# Patient Record
Sex: Male | Born: 1995 | Race: Black or African American | Hispanic: No | Marital: Single | Smoking: Never smoker
Health system: Southern US, Community
[De-identification: ages and names within clinical notes are randomized; demographics above are authoritative.]

## PROBLEM LIST (undated history)

## (undated) DIAGNOSIS — T7840XA Allergy, unspecified, initial encounter: Secondary | ICD-10-CM

## (undated) HISTORY — DX: Allergy, unspecified, initial encounter: T78.40XA

---
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
## 2013-06-17 ENCOUNTER — Encounter (HOSPITAL_COMMUNITY): Payer: Self-pay | Admitting: Emergency Medicine

## 2013-06-17 ENCOUNTER — Emergency Department (INDEPENDENT_AMBULATORY_CARE_PROVIDER_SITE_OTHER)
Admission: EM | Admit: 2013-06-17 | Discharge: 2013-06-17 | Disposition: A | Discharge status: Home / Self Care | Payer: Self-pay | Source: Home / Self Care | Attending: Family Medicine | Admitting: Family Medicine

## 2013-06-17 DIAGNOSIS — H6692 Otitis media, unspecified, left ear: Secondary | ICD-10-CM

## 2013-06-17 DIAGNOSIS — H6690 Otitis media, unspecified, unspecified ear: Secondary | ICD-10-CM

## 2013-06-17 MED ORDER — IPRATROPIUM BROMIDE 0.06 % NA SOLN: 2.0000 | Freq: Four times a day (QID) | NASAL | Status: DC

## 2013-06-17 MED ORDER — AMOXICILLIN 500 MG PO CAPS: 500.0000 mg | ORAL_CAPSULE | Freq: Three times a day (TID) | ORAL | Status: DC

## 2013-06-17 NOTE — ED Notes (Signed)
Pt  Reports  Last  Week  He  Had  A  Cold  [facility]  [facility] [facility]  [facility]   [place]  [place]  And  Felt a  P[op  In his  l  Ear  He  Now  Has  A  Muffled  Sensation and  l  Earache       He  States  He  Has  Had  Earaches  In past  And knows  What they feel like

## 2013-06-17 NOTE — Discharge Instructions (Signed)
Take all of medicine , use tylenol or advil for pain and fever as needed, see your doctor in 10 - 14 days for ear recheck if further problems. °

## 2013-06-17 NOTE — ED Provider Notes (Signed)
CSN: [id number]     Arrival date & time 06/17/13  1910 History   First MD Initiated Contact with Patient 06/17/13 1943     Chief Complaint  Patient presents with  . Otalgia   (Consider location/radiation/quality/duration/timing/severity/associated sxs/prior Treatment) Patient is a 18 y.o. male presenting with ear pain. The history is provided by the patient.  Otalgia Location:  Left Quality:  Pressure Severity:  Mild Onset quality:  Gradual Duration:  3 days Progression:  Worsening Chronicity:  New Context comment:  Recent uri Associated symptoms: congestion and rhinorrhea   Associated symptoms: no fever     History reviewed. No pertinent past medical history. History reviewed. No pertinent past surgical history. History reviewed. No pertinent family history. History  Substance Use Topics  . Smoking status: Not on file  . Smokeless tobacco: Not on file  . Alcohol Use: No    Review of Systems  Constitutional: Negative for fever.  HENT: Positive for congestion, ear pain and rhinorrhea.   Respiratory: Negative.     Allergies  Review of patient's allergies indicates no known allergies.  Home Medications   Current Outpatient Rx  Name  Route  Sig  Dispense  Refill  . amoxicillin (AMOXIL) 500 MG capsule   Oral   Take 1 capsule (500 mg total) by mouth 3 (three) times daily.   30 capsule   0   . ipratropium (ATROVENT) 0.06 % nasal spray   Nasal   Place 2 sprays into the nose 4 (four) times daily.   15 mL   1    BP 152/91  Pulse 64  Temp(Src) 98.3 F (36.8 C) (Oral)  Resp 16  SpO2 98% Physical Exam  Nursing note and vitals reviewed. Constitutional: He is oriented to person, place, and time. He appears well-developed and well-nourished.  HENT:  Head: Normocephalic.  Right Ear: Hearing, tympanic membrane, external ear and ear canal normal.  Left Ear: External ear and ear canal normal. Tympanic membrane is scarred, erythematous and bulging. Tympanic membrane  mobility is abnormal.  Mouth/Throat: Oropharynx is clear and moist.  Eyes: Conjunctivae and EOM are normal. Pupils are equal, round, and reactive to light.  Neck: Normal range of motion. Neck supple.  Lymphadenopathy:    He has no cervical adenopathy.  Neurological: He is alert and oriented to person, place, and time.  Skin: Skin is warm and dry.    ED Course  Procedures (including critical care time) Labs Review Labs Reviewed - No data to display Imaging Review No results found.  EKG Interpretation    Date/Time:    Ventricular Rate:    PR Interval:    QRS Duration:   QT Interval:    QTC Calculation:   R Axis:     Text Interpretation:              MDM      Linna HoffJames D Kindl, MD 06/17/13 2004

## 2013-09-05 IMAGING — CR DG CHEST 2V
2 series · 2 of 2 positions shown · non-contrast
Comparison: None.

CLINICAL DATA: Cough.

CHEST - 2 VIEW

[w chest pa]
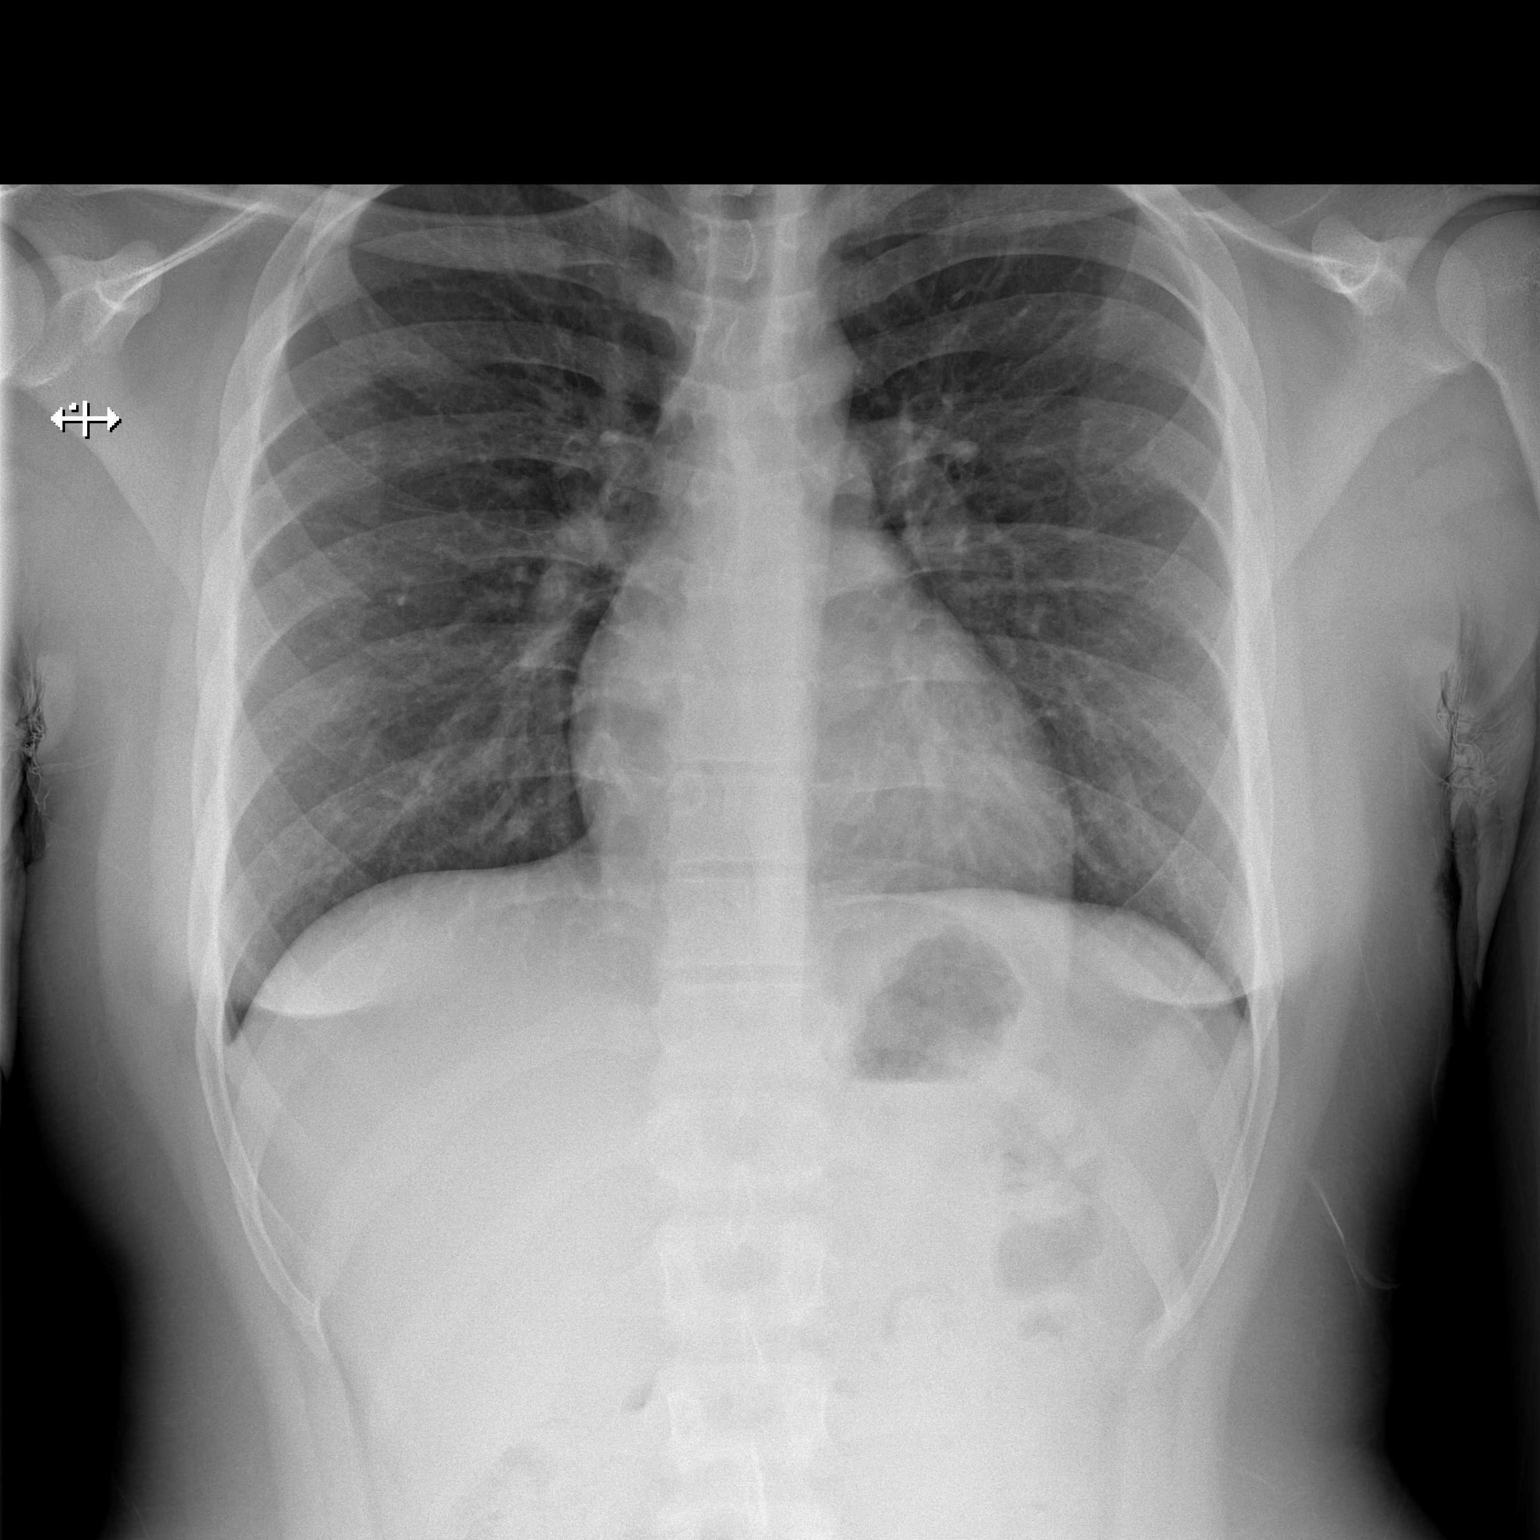

[w chest lat]
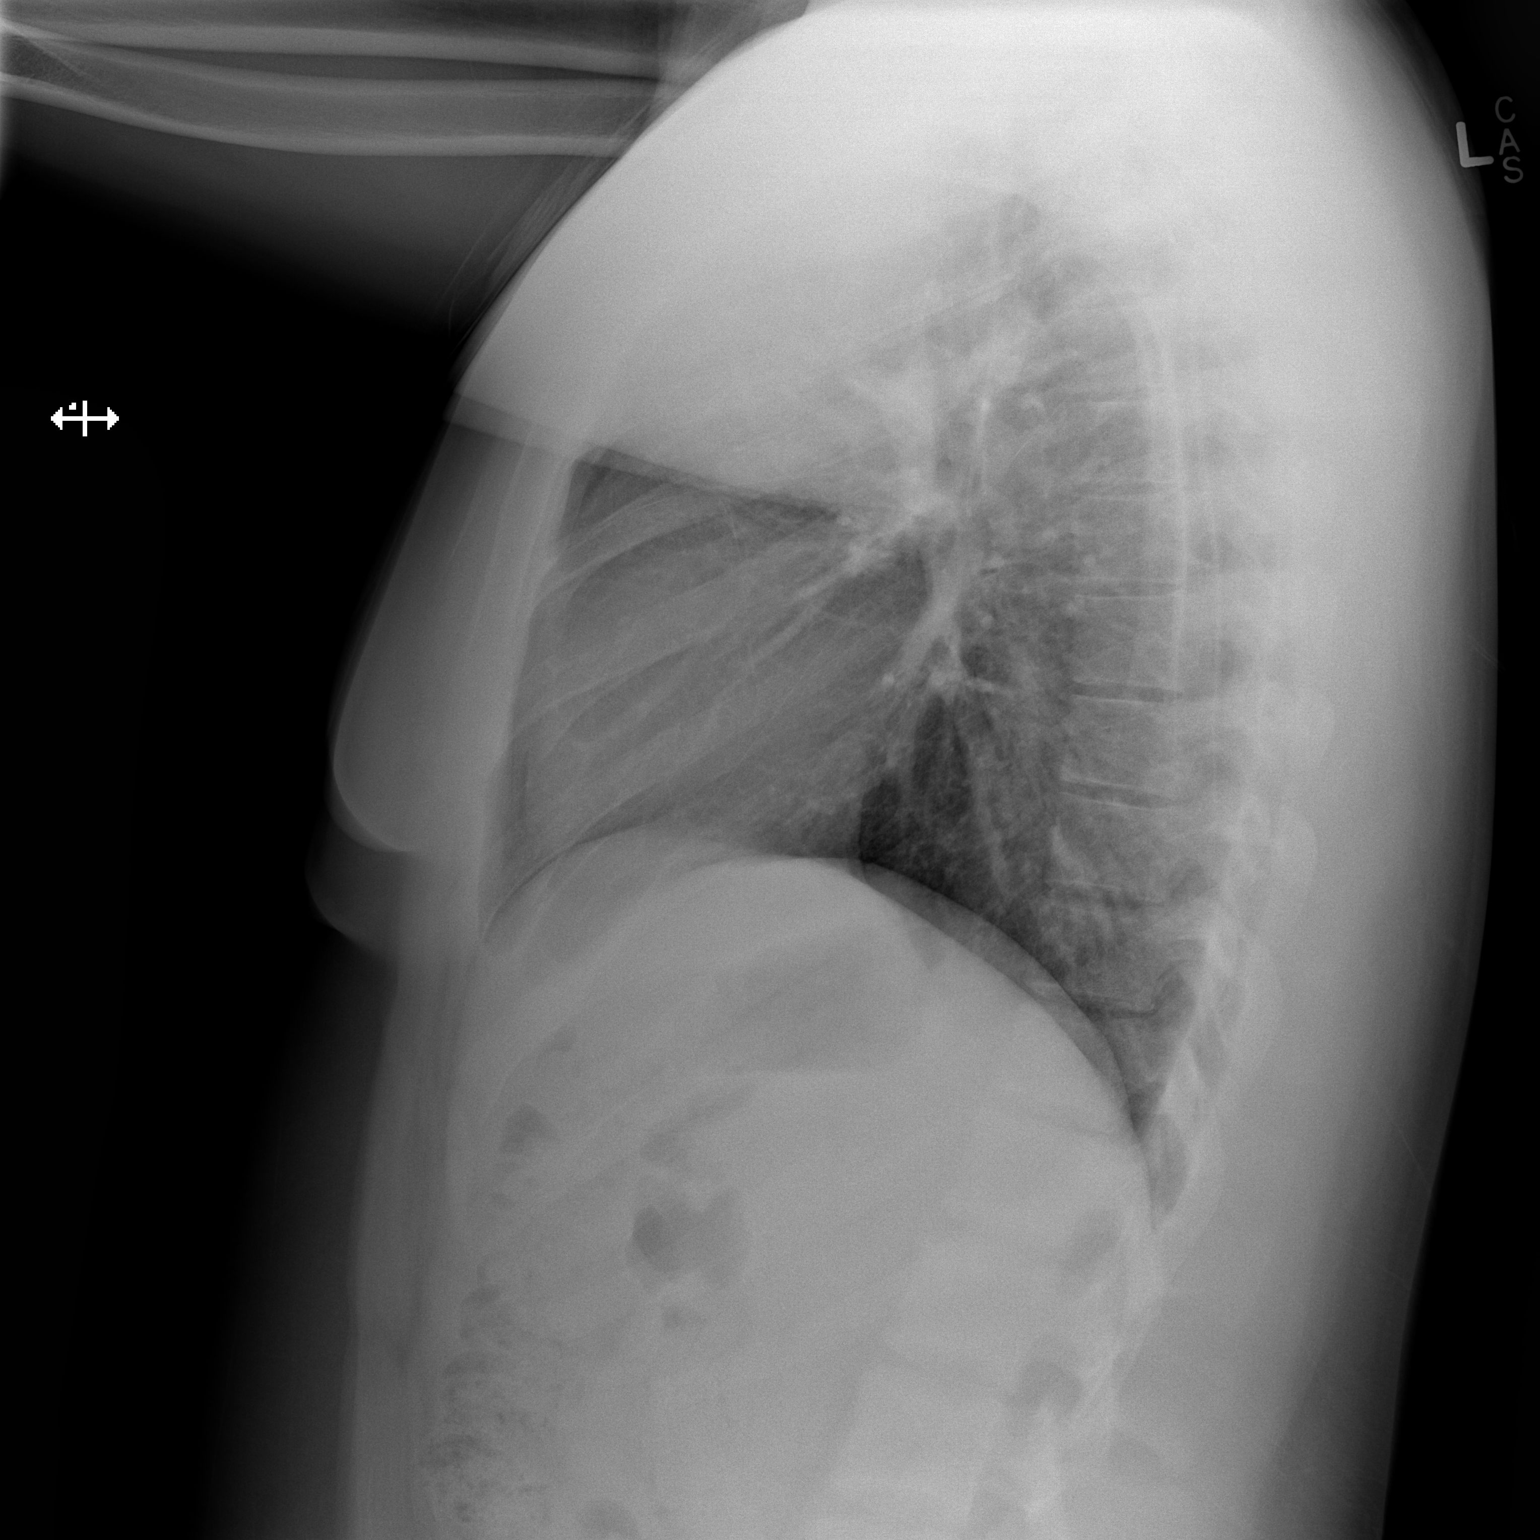

[2 of 2 positions shown; findings below may reference images not displayed]

FINDINGS: The heart size is normal.  The lungs are clear.  The
visualized soft tissues and bony thorax are unremarkable.
IMPRESSION: Negative chest.

## 2013-09-25 ENCOUNTER — Emergency Department (HOSPITAL_BASED_OUTPATIENT_CLINIC_OR_DEPARTMENT_OTHER)
Admission: EM | Admit: 2013-09-25 | Discharge: 2013-09-25 | Disposition: A | Discharge status: Home / Self Care | Payer: Self-pay | Attending: Emergency Medicine | Admitting: Emergency Medicine

## 2013-09-25 ENCOUNTER — Encounter (HOSPITAL_BASED_OUTPATIENT_CLINIC_OR_DEPARTMENT_OTHER): Payer: Self-pay | Admitting: Emergency Medicine

## 2013-09-25 DIAGNOSIS — Z792 Long term (current) use of antibiotics: Secondary | ICD-10-CM | POA: Insufficient documentation

## 2013-09-25 DIAGNOSIS — K6289 Other specified diseases of anus and rectum: Secondary | ICD-10-CM | POA: Insufficient documentation

## 2013-09-25 DIAGNOSIS — Z79899 Other long term (current) drug therapy: Secondary | ICD-10-CM | POA: Insufficient documentation

## 2013-09-25 NOTE — ED Notes (Signed)
Pt states pain started on Weds and thought was constipation, so took a stool softener, had a BM and states it hurt to "push it out".  No blood or external hemmrhoids noticed per patient. Pt denies any penetration of objects or other to rectal area.  Pt states pain comes and goes in waves and feeling like muscle cramping/stabbing pain.  No diarhea/nausea/vomiting.

## 2013-09-25 NOTE — ED Provider Notes (Signed)
CSN: [id number]     Arrival date & time 09/25/13  1119 History   First MD Initiated Contact with Patient 09/25/13 1148     Chief Complaint  Patient presents with  . Rectal Pain     (Consider location/radiation/quality/duration/timing/severity/associated sxs/prior Treatment) HPI Comments: Patient is a 18 year old male with no significant past medical history. He presents today with complaints of rectal pain. This is been going on for the past several days. He reports passing hard stool and states that it is painful to have a bowel movement. He denies any bleeding or trauma. He denies any anal penetration. He has tried stool softeners however this has not helped.  The history is provided by the patient.    History reviewed. No pertinent past medical history. History reviewed. No pertinent past surgical history. No family history on file. History  Substance Use Topics  . Smoking status: Not on file  . Smokeless tobacco: Not on file  . Alcohol Use: No    Review of Systems  All other systems reviewed and are negative.     Allergies  Review of patient's allergies indicates no known allergies.  Home Medications   Prior to Admission medications   Medication Sig Start Date End Date Taking? Authorizing Provider  amoxicillin (AMOXIL) 500 MG capsule Take 1 capsule (500 mg total) by mouth 3 (three) times daily. 06/17/13   Linna HoffJames D Kindl, MD  ipratropium (ATROVENT) 0.06 % nasal spray Place 2 sprays into the nose 4 (four) times daily. 06/17/13   Linna HoffJames D Kindl, MD   BP 121/85  Pulse 60  Temp(Src) 97.8 F (36.6 C) (Oral)  Resp 18  Ht 6' (1.829 m)  Wt 170 lb (77.111 kg)  BMI 23.05 kg/m2  SpO2 100% Physical Exam  Nursing note and vitals reviewed. Constitutional: He is oriented to person, place, and time. He appears well-developed and well-nourished. No distress.  HENT:  Head: Normocephalic and atraumatic.  Neck: Normal range of motion. Neck supple.  Genitourinary: Rectum normal.   The anus appears normal. There are no hemorrhoids, lesions, or other concerning findings. Rectal examination was limited due to pain.  Neurological: He is alert and oriented to person, place, and time.  Skin: Skin is warm and dry. He is not diaphoretic.    ED Course  Procedures (including critical care time) Labs Review Labs Reviewed - No data to display  Imaging Review No results found.   EKG Interpretation None      MDM   Final diagnoses:  None    Based on the nature of the patient's symptoms, I suspect this may be an anal fissure. My recommendations will be increased fiber, stool softeners, and followup with gastroenterology if not improving in the next several days. If he does not improve he may require a sigmoid or colonoscopy. He understands to return if he develops severe bleeding, high fever, or worsening pain.    Geoffery Lyonsouglas Rick Carruthers, MD 09/25/13 1159

## 2013-09-25 NOTE — Discharge Instructions (Signed)
Metamucil: 1 heaping teaspoon in a glass of water 3 times daily.  Colace (Equate stool softener): 100 mg twice daily. This medication is available over-the-counter.  Followup with gastroenterology if not improving in the next several days, and return to the emergency department if you develop severe leaving, high fever, or worsening pain.   Proctalgia Fugax Proctalgia fugax is a very short episode of intense rectal pain. It can last from seconds to minutes. It often occurs in the night, and awakens the person from sleep. It is not a sign of cancer.  CAUSES  The cause of this often intense rectal pain is not known. One possible cause may be spasm of the pelvic muscles or of the lowest part of the large intestine.  SYMPTOMS  The pain of proctalgia fugax:  Is intensely severe.  Lasts from only a few seconds to thirty minutes.  Usually awakens the person from sleep. DIAGNOSIS  In order to make sure that there are no other problems, diagnostic tests may be done such as:   Anoscopy. This is a lighted scope that is put into the rectum to look for abnormalities.  Barium enema. X-rays are taken after administering a radio-sensitive material. TREATMENT  A number of things have been used to try to treat this condition, including:  Medications.  Warm baths.  Relaxation techniques.  Gentle massage of the painful area. HOME CARE INSTRUCTIONS   Take all medications exactly as directed.  Follow any prescribed diet.  Follow instructions regarding both rest and physical activity.  Learn progressive relaxation techniques. SEEK IMMEDIATE MEDICAL CARE IF:   Your pain does not get better in the usual amount of time.  You develop any new symptoms. Document Released: 02/12/2001 Document Revised: 08/12/2011 Document Reviewed: 07/21/2008 [facility] Patient Information 2014 [place] [place].

## 2013-11-29 ENCOUNTER — Emergency Department (HOSPITAL_BASED_OUTPATIENT_CLINIC_OR_DEPARTMENT_OTHER)
Admission: EM | Admit: 2013-11-29 | Discharge: 2013-11-29 | Disposition: A | Discharge status: Home / Self Care | Payer: Self-pay | Attending: Emergency Medicine | Admitting: Emergency Medicine

## 2013-11-29 ENCOUNTER — Encounter (HOSPITAL_BASED_OUTPATIENT_CLINIC_OR_DEPARTMENT_OTHER): Payer: Self-pay | Admitting: Emergency Medicine

## 2013-11-29 DIAGNOSIS — Z79899 Other long term (current) drug therapy: Secondary | ICD-10-CM | POA: Insufficient documentation

## 2013-11-29 DIAGNOSIS — Z792 Long term (current) use of antibiotics: Secondary | ICD-10-CM | POA: Insufficient documentation

## 2013-11-29 DIAGNOSIS — R0982 Postnasal drip: Secondary | ICD-10-CM | POA: Insufficient documentation

## 2013-11-29 LAB — RAPID STREP SCREEN (MED CTR MEBANE ONLY): Streptococcus, Group A Screen (Direct): NEGATIVE

## 2013-11-29 MED ORDER — CETIRIZINE-PSEUDOEPHEDRINE ER 5-120 MG PO TB12: 1.0000 | ORAL_TABLET | Freq: Two times a day (BID) | ORAL | Status: DC

## 2013-11-29 MED ORDER — FLUTICASONE PROPIONATE 50 MCG/ACT NA SUSP: 2.0000 | Freq: Every day | NASAL | Status: DC

## 2013-11-29 NOTE — ED Notes (Signed)
Pt reports started having sore throat since last Monday but got a little better throughout the week now hurting again and pain with swallowing.  Denies cough, nasal congestion or fever.

## 2013-11-29 NOTE — ED Provider Notes (Signed)
CSN: [id number]     Arrival date & time 11/29/13  0118 History   First MD Initiated Contact with Patient 11/29/13 0217     Chief Complaint  Patient presents with  . Sore Throat     (Consider location/radiation/quality/duration/timing/severity/associated sxs/prior Treatment) Patient is a 18 y.o. male presenting with pharyngitis. The history is provided by the patient.  Sore Throat This is a new problem. The current episode started more than 1 week ago. The problem occurs constantly. The problem has not changed since onset.Pertinent negatives include no chest pain, no abdominal pain, no headaches and no shortness of breath. Nothing aggravates the symptoms. Nothing relieves the symptoms. He has tried nothing for the symptoms. The treatment provided no relief.  And nasal congestion.    History reviewed. No pertinent past medical history. History reviewed. No pertinent past surgical history. History reviewed. No pertinent family history. History  Substance Use Topics  . Smoking status: Never Smoker   . Smokeless tobacco: Not on file  . Alcohol Use: No    Review of Systems  Constitutional: Negative for fever.  HENT: Positive for congestion and sore throat. Negative for drooling, facial swelling, trouble swallowing and voice change.   Respiratory: Negative for shortness of breath.   Cardiovascular: Negative for chest pain.  Gastrointestinal: Negative for abdominal pain.  Neurological: Negative for headaches.  All other systems reviewed and are negative.     Allergies  Review of patient's allergies indicates no known allergies.  Home Medications   Prior to Admission medications   Medication Sig Start Date End Date Taking? Authorizing Provider  amoxicillin (AMOXIL) 500 MG capsule Take 1 capsule (500 mg total) by mouth 3 (three) times daily. 06/17/13   Linna HoffJames D Kindl, MD  ipratropium (ATROVENT) 0.06 % nasal spray Place 2 sprays into the nose 4 (four) times daily. 06/17/13   Linna HoffJames D  Kindl, MD   BP 148/76  Pulse 62  Temp(Src) 98.3 F (36.8 C) (Oral)  Resp 16  Ht 6' (1.829 m)  Wt 165 lb (74.844 kg)  BMI 22.37 kg/m2  SpO2 99% Physical Exam  Constitutional: He is oriented to person, place, and time. He appears well-developed and well-nourished. No distress.  HENT:  Head: Normocephalic and atraumatic.  Mouth/Throat: Oropharynx is clear and moist. No oropharyngeal exudate.  Cobblestoning and clear colorless PND  Eyes: Conjunctivae are normal. Pupils are equal, round, and reactive to light.  Neck: Normal range of motion. Neck supple. No tracheal deviation present.  No pain with displacement of the trachea  Cardiovascular: Normal rate, regular rhythm and intact distal pulses.   Pulmonary/Chest: Effort normal and breath sounds normal. No respiratory distress. He has no wheezes. He has no rales.  Abdominal: Soft. Bowel sounds are normal. There is no tenderness. There is no rebound.  Musculoskeletal: Normal range of motion.  Lymphadenopathy:    He has no cervical adenopathy.  Neurological: He is alert and oriented to person, place, and time.  Skin: Skin is warm and dry.  Psychiatric: He has a normal mood and affect.    ED Course  Procedures (including critical care time) Labs Review Labs Reviewed  RAPID STREP SCREEN  CULTURE, GROUP A STREP    Imaging Review No results found.   EKG Interpretation None      MDM   Final diagnoses:  None    Post nasal drip, likely allergic in nature will treat for same.      April Smitty CordsK Palumbo-Rasch, MD 11/29/13 0630

## 2013-11-30 LAB — CULTURE, GROUP A STREP

## 2015-04-20 ENCOUNTER — Other Ambulatory Visit: Payer: Self-pay | Admitting: *Deleted

## 2015-04-20 ENCOUNTER — Ambulatory Visit (INDEPENDENT_AMBULATORY_CARE_PROVIDER_SITE_OTHER): Payer: Self-pay | Admitting: Pharmacist Clinician (PhC)/ Clinical Pharmacy Specialist

## 2015-04-20 ENCOUNTER — Other Ambulatory Visit: Payer: Self-pay

## 2015-04-20 DIAGNOSIS — B20 Human immunodeficiency virus [HIV] disease: Secondary | ICD-10-CM

## 2015-04-20 DIAGNOSIS — Z206 Contact with and (suspected) exposure to human immunodeficiency virus [HIV]: Secondary | ICD-10-CM

## 2015-04-20 DIAGNOSIS — Z209 Contact with and (suspected) exposure to unspecified communicable disease: Secondary | ICD-10-CM

## 2015-04-20 LAB — HEPATITIS B CORE ANTIBODY, IGM: Hep B C IgM: NONREACTIVE

## 2015-04-20 LAB — HIV ANTIBODY (ROUTINE TESTING W REFLEX): HIV 1&2 Ab, 4th Generation: NONREACTIVE

## 2015-04-20 LAB — HEPATITIS C ANTIBODY: HCV Ab: NEGATIVE

## 2015-04-20 MED ORDER — EMTRICITABINE-TENOFOVIR DF 200-300 MG PO TABS: 1.0000 | ORAL_TABLET | Freq: Every day | ORAL | Status: DC

## 2015-04-20 MED ORDER — DOLUTEGRAVIR SODIUM 50 MG PO TABS: 50.0000 mg | ORAL_TABLET | Freq: Every day | ORAL | Status: DC

## 2015-04-20 NOTE — Progress Notes (Signed)
Patient ID: Cameron Patterson, male   DOB: 02/28/1996, 19 y.o.   MRN: [id number]  HPI: Cameron Patterson is a 19 y.o. male who presents for an initial evaluation for post-exposure prophylaxis (PEP).   Allergies: No Known Allergies  Vitals:    Past Medical History: No past medical history on file.  Social History: Social History   Social History  . Marital Status: Single    Spouse Name: N/A  . Number of Children: N/A  . Years of Education: N/A   Social History Main Topics  . Smoking status: Never Smoker   . Smokeless tobacco: Not on file  . Alcohol Use: No  . Drug Use: No  . Sexual Activity: Not on file   Other Topics Concern  . Not on file   Social History Narrative  . No narrative on file    Labs: No results found for: HIV1RNAQUANT, HIV1RNAVL, CD4TABS, HEPBSAB, HEPBSAG, HCVAB Rapid HIV: Negative   CrCl: CrCl cannot be calculated (Unknown ideal weight.).  Lipids: No results found for: CHOL, TRIG, HDL, CHOLHDL, VLDL, LDLCALC  Assessment: Mr. Cameron Patterson is a 19 year old male who presents for evaluation of post-exposure prophylaxis. He was sexually active with another male approximately 28 hours prior to his visit. He states his partner is known HIV positive and told him that he always takes his medications and his virus is controlled. Patient is concerned and would like to receive treatment with PEP.  I discussed with Mr. Cameron Patterson the treatment course we will use. We gave the patient 2 tablets of Genvoya, we observed the patient take his first tablet with us in the room. Patient was informed that we will treat him for a total of 28 days with Truvada and Tivicay. [facility] Pharmacy requested to fill 30 tablets so that they would not have to break bottles.  We also talked about future opportunities for PrEP. I encouraged the patient to think about this and to contact the clinic if he would like to discuss further.  Recommendations: - Tivicay and  Truvada 1 tablet daily for 28 days  - Patient instructed to call the clinic with any questions or to further discuss PrEP  Ancil Boozeraylor P Creasie Lacosse, PharmD. PGY-1 Pharmacy Resident Pager: [phone]  04/20/2015, 10:58 AM

## 2015-04-23 LAB — HIV-1 RNA QUANT-NO REFLEX-BLD

## 2015-05-20 ENCOUNTER — Other Ambulatory Visit: Payer: Self-pay | Admitting: Infectious Disease

## 2015-05-25 ENCOUNTER — Encounter (HOSPITAL_COMMUNITY): Payer: Self-pay

## 2015-05-25 ENCOUNTER — Emergency Department (HOSPITAL_COMMUNITY)
Admission: EM | Admit: 2015-05-25 | Discharge: 2015-05-25 | Disposition: A | Discharge status: Home / Self Care | Payer: No Typology Code available for payment source | Attending: Emergency Medicine | Admitting: Emergency Medicine

## 2015-05-25 DIAGNOSIS — X58XXXA Exposure to other specified factors, initial encounter: Secondary | ICD-10-CM | POA: Insufficient documentation

## 2015-05-25 DIAGNOSIS — J029 Acute pharyngitis, unspecified: Secondary | ICD-10-CM | POA: Diagnosis present

## 2015-05-25 DIAGNOSIS — S0121XA Laceration without foreign body of nose, initial encounter: Secondary | ICD-10-CM | POA: Insufficient documentation

## 2015-05-25 DIAGNOSIS — Z79899 Other long term (current) drug therapy: Secondary | ICD-10-CM | POA: Diagnosis not present

## 2015-05-25 DIAGNOSIS — Z792 Long term (current) use of antibiotics: Secondary | ICD-10-CM | POA: Insufficient documentation

## 2015-05-25 DIAGNOSIS — Y939 Activity, unspecified: Secondary | ICD-10-CM | POA: Diagnosis not present

## 2015-05-25 DIAGNOSIS — Y999 Unspecified external cause status: Secondary | ICD-10-CM | POA: Diagnosis not present

## 2015-05-25 DIAGNOSIS — Z7951 Long term (current) use of inhaled steroids: Secondary | ICD-10-CM | POA: Insufficient documentation

## 2015-05-25 DIAGNOSIS — Y929 Unspecified place or not applicable: Secondary | ICD-10-CM | POA: Insufficient documentation

## 2015-05-25 DIAGNOSIS — J069 Acute upper respiratory infection, unspecified: Secondary | ICD-10-CM | POA: Insufficient documentation

## 2015-05-25 LAB — RAPID STREP SCREEN (MED CTR MEBANE ONLY): Streptococcus, Group A Screen (Direct): NEGATIVE

## 2015-05-25 MED ORDER — IBUPROFEN 600 MG PO TABS: 600.0000 mg | ORAL_TABLET | Freq: Four times a day (QID) | ORAL | Status: DC | PRN

## 2015-05-25 MED ORDER — FEXOFENADINE-PSEUDOEPHED ER 60-120 MG PO TB12: 1.0000 | ORAL_TABLET | Freq: Two times a day (BID) | ORAL | Status: DC

## 2015-05-25 NOTE — Discharge Instructions (Signed)
Tylenol or ibuprofen for pain. Intranasal saline for congestion. Salt water gargles for sore throat. Take allegra d to help with congestions. Make sure to get plenty of rest, drink plenty of fluids. Follow up with primary care doctor for recheck     Upper Respiratory Infection, Adult Most upper respiratory infections (URIs) are a viral infection of the air passages leading to the lungs. A URI affects the nose, throat, and upper air passages. The most common type of URI is nasopharyngitis and is typically referred to as "the common cold." URIs run their course and usually go away on their own. Most of the time, a URI does not require medical attention, but sometimes a bacterial infection in the upper airways can follow a viral infection. This is called a secondary infection. Sinus and middle ear infections are common types of secondary upper respiratory infections. Bacterial pneumonia can also complicate a URI. A URI can worsen asthma and chronic obstructive pulmonary disease (COPD). Sometimes, these complications can require emergency medical care and may be life threatening.  CAUSES Almost all URIs are caused by viruses. A virus is a type of germ and can spread from one person to another.  RISKS FACTORS You may be at risk for a URI if:   You smoke.   You have chronic heart or lung disease.  You have a weakened defense (immune) system.   You are very young or very old.   You have nasal allergies or asthma.  You work in crowded or poorly ventilated areas.  You work in health care facilities or schools. SIGNS AND SYMPTOMS  Symptoms typically develop 2-3 days after you come in contact with a cold virus. Most viral URIs last 7-10 days. However, viral URIs from the influenza virus (flu virus) can last 14-18 days and are typically more severe. Symptoms may include:   Runny or stuffy (congested) nose.   Sneezing.   Cough.   Sore throat.   Headache.   Fatigue.   Fever.    Loss of appetite.   Pain in your forehead, behind your eyes, and over your cheekbones (sinus pain).  Muscle aches.  DIAGNOSIS  Your health care provider may diagnose a URI by:  Physical exam.  Tests to check that your symptoms are not due to another condition such as:  Strep throat.  Sinusitis.  Pneumonia.  Asthma. TREATMENT  A URI goes away on its own with time. It cannot be cured with medicines, but medicines may be prescribed or recommended to relieve symptoms. Medicines may help:  Reduce your fever.  Reduce your cough.  Relieve nasal congestion. HOME CARE INSTRUCTIONS   Take medicines only as directed by your health care provider.   Gargle warm saltwater or take cough drops to comfort your throat as directed by your health care provider.  Use a warm mist humidifier or inhale steam from a shower to increase air moisture. This may make it easier to breathe.  Drink enough fluid to keep your urine clear or pale yellow.   Eat soups and other clear broths and maintain good nutrition.   Rest as needed.   Return to work when your temperature has returned to normal or as your health care provider advises. You may need to stay home longer to avoid infecting others. You can also use a face mask and careful hand washing to prevent spread of the virus.  Increase the usage of your inhaler if you have asthma.   Do not use any tobacco products,  including cigarettes, chewing tobacco, or electronic cigarettes. If you need help quitting, ask your health care provider. PREVENTION  The best way to protect yourself from getting a cold is to practice good hygiene.   Avoid oral or hand contact with people with cold symptoms.   Wash your hands often if contact occurs.  There is no clear evidence that vitamin C, vitamin E, echinacea, or exercise reduces the chance of developing a cold. However, it is always recommended to get plenty of rest, exercise, and practice good  nutrition.  SEEK MEDICAL CARE IF:   You are getting worse rather than better.   Your symptoms are not controlled by medicine.   You have chills.  You have worsening shortness of breath.  You have brown or red mucus.  You have yellow or brown nasal discharge.  You have pain in your face, especially when you bend forward.  You have a fever.  You have swollen neck glands.  You have pain while swallowing.  You have white areas in the back of your throat. SEEK IMMEDIATE MEDICAL CARE IF:   You have severe or persistent:  Headache.  Ear pain.  Sinus pain.  Chest pain.  You have chronic lung disease and any of the following:  Wheezing.  Prolonged cough.  Coughing up blood.  A change in your usual mucus.  You have a stiff neck.  You have changes in your:  Vision.  Hearing.  Thinking.  Mood. MAKE SURE YOU:   Understand these instructions.  Will watch your condition.  Will get help right away if you are not doing well or get worse.   This information is not intended to replace advice given to you by your health care provider. Make sure you discuss any questions you have with your health care provider.   Document Released: 11/13/2000 Document Revised: 10/04/2014 Document Reviewed: 08/25/2013 Elsevier Interactive Patient Education [employer]

## 2015-05-25 NOTE — ED Notes (Signed)
Pt has had head cold symptoms since Sunday, sore throat, runny nose, cough, hoarse, denies fevers. Sore throat worse today.

## 2015-05-25 NOTE — ED Provider Notes (Signed)
CSN: [id number]     Arrival date & time 05/25/15  0531 History   First MD Initiated Contact with Patient 05/25/15 0601     Chief Complaint  Patient presents with  . Sore Throat     (Consider location/radiation/quality/duration/timing/severity/associated sxs/prior Treatment) HPI Cameron Patterson is a 19 y.o. male with no medical problems, presents to emergency department complaining of nasal congestion and sore throat. Patient states he has had nasal congestion, sore throat, cough for the last 3 days. He states this morning he woke up with worsening sore throat. He states he decided to come to emergency department because "I could not take the pain anymore." He has been taking Mucinex for the cough, and has tried some over-the-counter cold medication with no relief of her symptoms. He denies any fever or chills. He denies any shortness of breath.  History reviewed. No pertinent past medical history. History reviewed. No pertinent past surgical history. No family history on file. Social History  Substance Use Topics  . Smoking status: Never Smoker   . Smokeless tobacco: None  . Alcohol Use: No    Review of Systems  Constitutional: Negative for fever and chills.  HENT: Positive for congestion and sore throat. Negative for ear pain, trouble swallowing and voice change.   Respiratory: Positive for cough. Negative for chest tightness and shortness of breath.   Cardiovascular: Negative for chest pain, palpitations and leg swelling.  Gastrointestinal: Negative for nausea, vomiting, abdominal pain, diarrhea and abdominal distention.  Musculoskeletal: Negative for myalgias, arthralgias, neck pain and neck stiffness.  Skin: Negative for rash.  Allergic/Immunologic: Negative for immunocompromised state.  Neurological: Negative for dizziness, weakness, light-headedness, numbness and headaches.  All other systems reviewed and are negative.     Allergies  Review of patient's allergies  indicates no known allergies.  Home Medications   Prior to Admission medications   Medication Sig Start Date End Date Taking? Authorizing Provider  amoxicillin (AMOXIL) 500 MG capsule Take 1 capsule (500 mg total) by mouth 3 (three) times daily. 06/17/13   Linna Hoff, MD  cetirizine-pseudoephedrine (ZYRTEC-D) 5-120 MG per tablet Take 1 tablet by mouth 2 (two) times daily. 11/29/13   April Palumbo, MD  dolutegravir (TIVICAY) 50 MG tablet Take 1 tablet (50 mg total) by mouth daily. 04/20/15   Judyann Munson, MD  emtricitabine-tenofovir (TRUVADA) 200-300 MG tablet Take 1 tablet by mouth daily. 04/20/15   Judyann Munson, MD  fluticasone (FLONASE) 50 MCG/ACT nasal spray Place 2 sprays into both nostrils daily. 11/29/13   April Palumbo, MD  ipratropium (ATROVENT) 0.06 % nasal spray Place 2 sprays into the nose 4 (four) times daily. 06/17/13   Linna Hoff, MD   BP 157/90 mmHg  Pulse 85  Temp(Src) 99 F (37.2 C)  Resp 18  Ht  (1.854 m)  Wt 80.74 kg  BMI 23.49 kg/m2 Physical Exam  Constitutional: He is oriented to person, place, and time. He appears well-developed and well-nourished. No distress.  HENT:  Head: Normocephalic and atraumatic.  Right Ear: Tympanic membrane, external ear and ear canal normal.  Left Ear: Tympanic membrane, external ear and ear canal normal.  Nose: Mucosal edema and nose lacerations present.  Mouth/Throat: Uvula is midline and mucous membranes are normal. Posterior oropharyngeal erythema present. No oropharyngeal exudate, posterior oropharyngeal edema or tonsillar abscesses.  Eyes: Conjunctivae are normal.  Neck: Neck supple.  Cardiovascular: Normal rate, regular rhythm and normal heart sounds.   Pulmonary/Chest: Effort normal and breath sounds normal. No  respiratory distress. He has no wheezes. He has no rales.  Abdominal: Soft. Bowel sounds are normal. He exhibits no distension. There is no tenderness. There is no rebound.  Musculoskeletal: He exhibits no  edema.  Neurological: He is alert and oriented to person, place, and time.  Skin: Skin is warm and dry.  Nursing note and vitals reviewed.   ED Course  Procedures (including critical care time) Labs Review Labs Reviewed  RAPID STREP SCREEN (NOT AT [facility])  CULTURE, GROUP A STREP      Imaging Review No results found. I have personally reviewed and evaluated these images and lab results as part of my medical decision-making.   EKG Interpretation None      MDM   Final diagnoses:  Viral URI    patient with nasal congestion, cough, sore throat for 3 days. He is afebrile, nontoxic appearing. Mildly hypertensive, otherwise normal vital signs. Patient does not have a primary care doctor. Lungs are clear. Exam unremarkable. Rapid strep is negative.  Most likely viral URI. Will treat symptomatically with Claritin-D, saline gargles, intranasal saline, Motrin and Tylenol for pain. Follow-up with primary care doctor.   Filed Vitals:   05/25/15 0541 05/25/15 0726  BP: 157/90 157/90  Pulse: 85 84  Temp: 99 F (37.2 C) 100.1 F (37.8 C)  TempSrc:  Oral  Resp: 18 19  Height: 6\' 1"  (1.854 m)   Weight: 80.74 kg   SpO2: 99% 100%     Jaynie Crumbleatyana Kaamil Morefield, PA-C 05/25/15 1524  Tomasita CrumbleAdeleke Oni, MD 05/26/15 [phone]

## 2015-05-27 LAB — CULTURE, GROUP A STREP: Strep A Culture: NEGATIVE

## 2015-08-15 ENCOUNTER — Telehealth: Payer: Self-pay | Admitting: Infectious Disease

## 2015-08-15 NOTE — Telephone Encounter (Signed)
Thiis young man was given PEP for high risk exposure to HIV positive patient  He was seen by our pharmacists and given PEP  I would like him to come back and either  A) consider HPTN study  Or  B) slot into our pharmacy driven PreP study  There are also 2 additional studies [facility] if he didn't want to do TRIUMPH study  The "AMP" study and  [place] Study of [place]cov[place] vs Truvada  I think for this patient either HPTN or the GIlead would be best study since in both of those studies PrEP is provided by the study. In AMP it is my understanding half the patients do not get the antibody but can get Truvada PrEP rx along with the study in this case presumably via [employer] access

## 2015-10-16 ENCOUNTER — Telehealth: Payer: Self-pay

## 2015-10-16 NOTE — Telephone Encounter (Signed)
Error

## 2015-11-22 ENCOUNTER — Telehealth: Payer: Self-pay

## 2015-11-22 NOTE — Telephone Encounter (Signed)
Contacted 5/23, left a VM re Prep study to inquire about interests.

## 2019-03-04 ENCOUNTER — Telehealth: Payer: Self-pay | Admitting: Pharmacy Technician

## 2019-03-04 ENCOUNTER — Ambulatory Visit: Payer: No Typology Code available for payment source | Admitting: Pharmacist

## 2019-03-04 NOTE — Telephone Encounter (Signed)
[facility] Patient Advocate Encounter    Findings of the benefits investigation conducted this morning via test claims for the patient's upcoming appointment on 10/01 are as follows:   Insurance: [place] (active commercial)  Prior Authorization: required for [employer] Patient Advocate will follow up once patient arrives for their appointment.  Bartholomew Crews, CPhT [facility] Patient [facility] Phone: [phone] Fax: [phone] 03/04/2019 10:36 AM

## 2019-03-11 ENCOUNTER — Ambulatory Visit: Payer: No Typology Code available for payment source | Admitting: Pharmacist

## 2019-04-08 ENCOUNTER — Ambulatory Visit: Payer: Managed Care, Other (non HMO) | Admitting: Pharmacist

## 2019-04-08 NOTE — Telephone Encounter (Signed)
All insurance information is the same as 03/04/2019.

## 2022-08-13 ENCOUNTER — Ambulatory Visit: Payer: 59 | Admitting: Physician Assistant

## 2022-09-07 ENCOUNTER — Ambulatory Visit: Payer: Self-pay

## 2022-09-24 ENCOUNTER — Ambulatory Visit (INDEPENDENT_AMBULATORY_CARE_PROVIDER_SITE_OTHER): Payer: 59 | Admitting: Physician Assistant

## 2022-09-24 ENCOUNTER — Encounter: Payer: Self-pay | Admitting: Physician Assistant

## 2022-09-24 VITALS — BP 146/90 | HR 86 | Temp 97.7°F | Ht 71.0 in | Wt 217.2 lb

## 2022-09-24 DIAGNOSIS — E669 Obesity, unspecified: Secondary | ICD-10-CM | POA: Diagnosis not present

## 2022-09-24 DIAGNOSIS — R030 Elevated blood-pressure reading, without diagnosis of hypertension: Secondary | ICD-10-CM

## 2022-09-24 LAB — CBC WITH DIFFERENTIAL/PLATELET
MCV: 92 fL (ref 80.0–100.0)
Neutrophils Relative %: 53 %
Platelets: 282 10*3/uL (ref 140–400)
WBC: 6.8 10*3/uL (ref 3.8–10.8)

## 2022-09-24 NOTE — Patient Instructions (Signed)
It was great to see you!  Check blood pressure 2-3 x per WEEK and record for [place] Try to start walking/exercising 20-30 min 2-3 days per week  Let's follow-up in 1 month for a physical and to review your blood pressures, sooner if you have concerns.  Take care,  Jarold Motto PA-C

## 2022-09-24 NOTE — Progress Notes (Signed)
Cameron Patterson is a 27 y.o. male here for to establish care.  History of Present Illness:   Chief Complaint  Patient presents with   Establish Care   c/o elevated blood pressure    Pt was told Blood pressure was elevated last time it was checked.    HPI  Elevated blood pressure reading: His blood pressure was elevated during this visit at 156/90. He states his blood pressure was also elevated at his last annual check up at [facility].  He drinks a zero cal drink with about 120 mg of caffeine once a day.  Otherwise he drinks plenty water and does not drink soda.   He monitors his salt intake well and avoids adding salt after cooking.  He also avoids heavy carbs and tries to eat more vegetables.  Occasionally exercises and walks with incline on treadmill. Has a family history of hypertension (mother & great-grandmother).  He is receptive to monitoring his blood pressure at home.   BP Readings from Last 3 Encounters:  09/24/22 (!) 146/90  05/25/15 157/90  11/29/13 148/76    Past Medical History:  Diagnosis Date   Allergy      Social History   Tobacco Use   Smoking status: Never   Smokeless tobacco: Never  Vaping Use   Vaping Use: Never used  Substance Use Topics   Alcohol use: Yes    Comment: socially   Drug use: Never    History reviewed. No pertinent surgical history.  Family History  Problem Relation Age of Onset   Hypertension Mother    Hypertension Maternal Great-grandmother     No Known Allergies  Current Medications:   Current Outpatient Medications:    cetirizine (ZYRTEC) 10 MG tablet, Take 10 mg by mouth daily., Disp: , Rfl:    Review of Systems:   ROS Negative unless otherwise specified per HPI.  Vitals:   Vitals:   09/24/22 0902 09/24/22 0925  BP: (!) 156/90 (!) 146/90  Pulse: 86   Temp: 97.7 F (36.5 C)   TempSrc: Temporal   SpO2: 96%   Weight: 217 lb 4 oz (98.5 kg)   Height:  (1.803 m)      Body mass index is  30.3 kg/m.  Physical Exam:   Physical Exam Vitals and nursing note reviewed.  Constitutional:      General: He is not in acute distress.    Appearance: He is well-developed. He is not ill-appearing or toxic-appearing.  Cardiovascular:     Rate and Rhythm: Normal rate and regular rhythm.     Pulses: Normal pulses.     Heart sounds: Normal heart sounds, S1 normal and S2 normal.  Pulmonary:     Effort: Pulmonary effort is normal.     Breath sounds: Normal breath sounds.  Skin:    General: Skin is warm and dry.  Neurological:     Mental Status: He is alert.     GCS: GCS eye subscore is 4. GCS verbal subscore is 5. GCS motor subscore is 6.  Psychiatric:        Speech: Speech normal.        Behavior: Behavior normal. Behavior is cooperative.     Assessment and Plan:   Elevated blood pressure reading No red flags No evidence of end-organ damage Recommend close home monitoring and review of these readings in 1 month, sooner if concerns Update blood work today  Will determine if need for medication at next visit based on  BP log and in-office readings   I,Rachel Rivera,acting as a scribe for [employer], PA.,have documented all relevant documentation on the behalf of Jarold Motto, PA,as directed by  Jarold Motto, PA while in the presence of Jarold Motto, [place].  I, Jarold Motto, [place], have reviewed all documentation for this visit. The documentation on 09/24/22 for the exam, diagnosis, procedures, and orders are all accurate and complete.   Jarold Motto, PA-C

## 2022-09-25 LAB — COMPREHENSIVE METABOLIC PANEL
AG Ratio: 1.6 (calc) (ref 1.0–2.5)
ALT: 22 U/L (ref 9–46)
AST: 18 U/L (ref 10–40)
Albumin: 4.2 g/dL (ref 3.6–5.1)
Alkaline phosphatase (APISO): 53 U/L (ref 36–130)
BUN: 10 mg/dL (ref 7–25)
CO2: 25 mmol/L (ref 20–32)
Calcium: 9.7 mg/dL (ref 8.6–10.3)
Chloride: 104 mmol/L (ref 98–110)
Creat: 1.02 mg/dL (ref 0.60–1.24)
Globulin: 2.7 g/dL (calc) (ref 1.9–3.7)
Glucose, Bld: 101 mg/dL — ABNORMAL HIGH (ref 65–99)
Potassium: 4 mmol/L (ref 3.5–5.3)
Sodium: 140 mmol/L (ref 135–146)
Total Bilirubin: 0.5 mg/dL (ref 0.2–1.2)
Total Protein: 6.9 g/dL (ref 6.1–8.1)

## 2022-09-25 LAB — CBC WITH DIFFERENTIAL/PLATELET
Absolute Monocytes: 503 cells/uL (ref 200–950)
Basophils Absolute: 61 cells/uL (ref 0–200)
Basophils Relative: 0.9 %
Eosinophils Absolute: 82 cells/uL (ref 15–500)
Eosinophils Relative: 1.2 %
HCT: 45 % (ref 38.5–50.0)
Hemoglobin: 15.1 g/dL (ref 13.2–17.1)
Lymphs Abs: 2550 cells/uL (ref 850–3900)
MCH: 30.9 pg (ref 27.0–33.0)
MCHC: 33.6 g/dL (ref 32.0–36.0)
MPV: 10.2 fL (ref 7.5–12.5)
Monocytes Relative: 7.4 %
Neutro Abs: 3604 cells/uL (ref 1500–7800)
RBC: 4.89 10*6/uL (ref 4.20–5.80)
RDW: 12.2 % (ref 11.0–15.0)
Total Lymphocyte: 37.5 %

## 2022-09-25 LAB — LIPID PANEL
Cholesterol: 178 mg/dL (ref <200)
HDL: 47 mg/dL (ref >=40)
LDL Cholesterol (Calc): 106 mg/dL (calc) — ABNORMAL HIGH
Non-HDL Cholesterol (Calc): 131 mg/dL (calc) — ABNORMAL HIGH (ref <130)
Total CHOL/HDL Ratio: 3.8 (calc) (ref <5.0)
Triglycerides: 134 mg/dL (ref <150)

## 2022-09-25 LAB — TSH: TSH: 0.44 mIU/L (ref 0.40–4.50)

## 2022-10-24 ENCOUNTER — Encounter: Payer: Self-pay | Admitting: Physician Assistant

## 2022-10-24 ENCOUNTER — Ambulatory Visit (INDEPENDENT_AMBULATORY_CARE_PROVIDER_SITE_OTHER): Payer: 59 | Admitting: Physician Assistant

## 2022-10-24 VITALS — BP 140/90 | HR 78 | Temp 98.4°F | Ht 71.0 in | Wt 217.5 lb

## 2022-10-24 DIAGNOSIS — R030 Elevated blood-pressure reading, without diagnosis of hypertension: Secondary | ICD-10-CM

## 2022-10-24 DIAGNOSIS — E669 Obesity, unspecified: Secondary | ICD-10-CM | POA: Diagnosis not present

## 2022-10-24 DIAGNOSIS — Z0000 Encounter for general adult medical examination without abnormal findings: Secondary | ICD-10-CM | POA: Diagnosis not present

## 2022-10-24 NOTE — Patient Instructions (Addendum)
It was great to see you!  Continue to monitor BP at home Message me in 3 months with averages and we will go from there -- sooner if concerns!  Take care,  Lelon Mast

## 2022-10-24 NOTE — Progress Notes (Signed)
Subjective:    Cameron Patterson is a 27 y.o. male and is here for a comprehensive physical exam.  HPI  There are no preventive care reminders to display for this patient.  Acute Concerns: None.  Chronic Issues: Hypertension: He has been monitoring his blood pressure at home and brought his log.  His systolic readings average in the upper 140s-150s and diastolic readings are usually in the low 90s.  He has been exercising more regularly and has made dietary changes.  He has been losing weight and has a goal of losing an additional 10 lbs. Would like to continue to try to manage his elevated blood pressure with exercise and healthy diet before starting antihypertensives.  BP Readings from Last 3 Encounters:  10/24/22 (!) 140/90  09/24/22 (!) 146/90  05/25/15 157/90     Health Maintenance: Immunizations -- UTD Colonoscopy -- N/a PSA -- N/a Diet -- Low sodium intake. Eating oatmeal for breakfast, previously would skip breakfast. Sleep habits -- He tends to sleep late, goes to bed at 11:30-12. Previously took melatonin but has not been taking it lately.  Exercise -- Staying active and exercises regularly. Walks on treadmill with incline daily   Weight --  Recent weight history Wt Readings from Last 10 Encounters:  10/24/22 217 lb 8 oz (98.7 kg)  09/24/22 217 lb 4 oz (98.5 kg)  05/25/15 178 lb (80.7 kg)  11/29/13 165 lb (74.8 kg) (64 %, Z= 0.36)*  09/25/13 170 lb (77.1 kg) (71 %, Z= 0.55)*   * Growth percentiles are based on CDC (Boys, 2-20 Years) data.   Body mass index is 30.34 kg/m.  Mood -- Stable. Alcohol use --  reports current alcohol use.  Tobacco use --  Tobacco Use: Low Risk  (10/24/2022)   Patient History    Smoking Tobacco Use: Never    Smokeless Tobacco Use: Never    Passive Exposure: Not on file    Eligible for Low Dose CT? No  UTD with eye doctor? Yes UTD with dentist? Yes     10/24/2022    8:37 AM  Depression screen PHQ 2/9  Decreased  Interest 0  Down, Depressed, Hopeless 0  PHQ - 2 Score 0    Other providers/specialists: Patient Care Team: Jarold Motto, [place] as PCP - General (Physician Assistant)    PMHx, SurgHx, SocialHx, Medications, and Allergies were reviewed in the Visit Navigator and updated as appropriate.   Past Medical History:  Diagnosis Date   Allergy     No past surgical history on file.   Family History  Problem Relation Age of Onset   Hypertension Mother    Hypertension Maternal Great-grandmother    Colon cancer Neg Hx    Prostate cancer Neg Hx     Social History   Tobacco Use   Smoking status: Never   Smokeless tobacco: Never  Vaping Use   Vaping Use: Never used  Substance Use Topics   Alcohol use: Yes    Comment: socially   Drug use: Never    Review of Systems:   Review of Systems  Constitutional:  Negative for chills, fever, malaise/fatigue and weight loss.  HENT:  Negative for hearing loss, sinus pain and sore throat.   Respiratory:  Negative for cough and hemoptysis.   Cardiovascular:  Negative for chest pain, palpitations, leg swelling and PND.  Gastrointestinal:  Negative for abdominal pain, constipation, diarrhea, heartburn, nausea and vomiting.  Genitourinary:  Negative for dysuria, frequency and urgency.  Musculoskeletal:  Negative for back pain, myalgias and neck pain.  Skin:  Negative for itching and rash.  Neurological:  Negative for dizziness, tingling, seizures and headaches.  Endo/Heme/Allergies:  Negative for polydipsia.  Psychiatric/Behavioral:  Negative for depression. The patient is not nervous/anxious.     Objective:    Vitals:   10/24/22 0837 10/24/22 0854  BP: (!) 148/90 (!) 140/90  Pulse: 78   Temp: 98.4 F (36.9 C)   SpO2: 98%     Body mass index is 30.34 kg/m.  General  Alert, cooperative, no distress, appears stated age  Head:  Normocephalic, without obvious abnormality, atraumatic  Eyes:  PERRL, conjunctiva/corneas clear, EOM's  intact, fundi benign, both eyes       Ears:  Normal TM's and external ear canals, both ears  Nose: Nares normal, septum midline, mucosa normal, no drainage or sinus tenderness  Throat: Lips, mucosa, and tongue normal; teeth and gums normal  Neck: Supple, symmetrical, trachea midline, no adenopathy;     thyroid:  No enlargement/tenderness/nodules; no carotid bruit or JVD  Back:   Symmetric, no curvature, ROM normal, no CVA tenderness  Lungs:   Clear to auscultation bilaterally, respirations unlabored  Chest wall:  No tenderness or deformity  Heart:  Regular rate and rhythm, S1 and S2 normal, no murmur, rub or gallop  Abdomen:   Soft, non-tender, bowel sounds active all four quadrants, no masses, no organomegaly  Extremities: Extremities normal, atraumatic, no cyanosis or edema  Prostate : Deferred   Skin: Skin color, texture, turgor normal, no rashes or lesions  Lymph nodes: Cervical, supraclavicular, and axillary nodes normal  Neurologic: CNII-XII grossly intact. Normal strength, sensation and reflexes throughout   AssessmentPlan:   Routine physical examination Today patient counseled on age appropriate routine health concerns for screening and prevention, each reviewed and up to date or declined. Immunizations reviewed and up to date or declined. Labs ordered and reviewed. Risk factors for depression reviewed and negative. Hearing function and visual acuity are intact. ADLs screened and addressed as needed. Functional ability and level of safety reviewed and appropriate. Education, counseling and referrals performed based on assessed risks today. Patient provided with a copy of personalized plan for preventive services.  Elevated blood pressure reading Above goal No evidence of end organ damage Discussed starting low dose medication vs continued life-style modifications to help lower He is reluctant to start medications, we will allow him to continue to monitor at home for the next 3  months -- recommend he send [place] averages in 3 months, sooner if concerns and we will advise further  Obesity, unspecified classification, unspecified obesity type, unspecified whether serious comorbidity present Continue efforts at healthy lifestyle   I,Rachel Rivera,acting as a scribe for Jarold Motto, PA.,have documented all relevant documentation on the behalf of Jarold Motto, PA,as directed by  Jarold Motto, PA while in the presence of Jarold Motto, [place].  I, Jarold Motto, [place], have reviewed all documentation for this visit. The documentation on 10/24/22 for the exam, diagnosis, procedures, and orders are all accurate and complete.  Jarold Motto, PA-C [facility]

## 2023-04-21 ENCOUNTER — Other Ambulatory Visit (HOSPITAL_COMMUNITY)
Admission: RE | Admit: 2023-04-21 | Discharge: 2023-04-21 | Disposition: A | Discharge status: Home / Self Care | Payer: 59 | Source: Ambulatory Visit | Attending: Physician Assistant | Admitting: Physician Assistant

## 2023-04-21 ENCOUNTER — Ambulatory Visit (INDEPENDENT_AMBULATORY_CARE_PROVIDER_SITE_OTHER): Payer: 59 | Admitting: Physician Assistant

## 2023-04-21 ENCOUNTER — Encounter: Payer: Self-pay | Admitting: Physician Assistant

## 2023-04-21 VITALS — Temp 98.2°F | Ht 71.0 in | Wt 222.0 lb

## 2023-04-21 DIAGNOSIS — Z113 Encounter for screening for infections with a predominantly sexual mode of transmission: Secondary | ICD-10-CM

## 2023-04-21 DIAGNOSIS — R030 Elevated blood-pressure reading, without diagnosis of hypertension: Secondary | ICD-10-CM | POA: Diagnosis not present

## 2023-04-21 NOTE — Progress Notes (Signed)
Cameron Patterson is a 27 y.o. male here for a new problem.  History of Present Illness:   Chief Complaint  Patient presents with   STD testing    Possibility of exposure   HPI  STD Screening: Presents to STD screening for gonorrhea and chlamydia. Reports that he recently got a message from a recent sexual partner that they were having some symptoms and did test positive for gonorrhea. States his last sexual encounter with that partner was 11/02.  He uses condoms with all encounters.  Chaperone present during anal pap: Corky Mull  Hypertension: Monitored at home about every 2.5 weeks, but notes he is getting good readings - never over 150/100.  Denies excessive caffeine intake, stimulant usage, excessive alcohol intake, or increase in salt consumption.  Denies chest pain, shortness of breath, blurred vision, dizziness, unusual headaches, or lower leg swelling.  BP Readings from Last 3 Encounters:  10/24/22 (!) 140/90  09/24/22 (!) 146/90  05/25/15 157/90   Past Medical History:  Diagnosis Date   Allergy     Social History   Tobacco Use   Smoking status: Never   Smokeless tobacco: Never  Vaping Use   Vaping status: Never Used  Substance Use Topics   Alcohol use: Yes    Comment: socially   Drug use: Never   No past surgical history on file. Family History  Problem Relation Age of Onset   Hypertension Mother    Hypertension Maternal Great-grandmother    Colon cancer Neg Hx    Prostate cancer Neg Hx    No Known Allergies Current Medications:   Current Outpatient Medications:    cetirizine (ZYRTEC) 10 MG tablet, Take 10 mg by mouth daily., Disp: , Rfl:   Review of Systems:   ROS See pertinent positives and negatives as per the HPI.  Vitals:   Vitals:   04/21/23 0846  Temp: 98.2 F (36.8 C)  TempSrc: Temporal  Weight: 222 lb (100.7 kg)  Height: 5\' 11"  (1.803 m)     Body mass index is 30.96 kg/m.  Physical Exam:   Physical Exam Vitals and  nursing note reviewed. Exam conducted with a chaperone present.  Constitutional:      General: He is not in acute distress.    Appearance: He is well-developed. He is not ill-appearing or toxic-appearing.  Cardiovascular:     Rate and Rhythm: Normal rate and regular rhythm.     Pulses: Normal pulses.     Heart sounds: Normal heart sounds, S1 normal and S2 normal.  Pulmonary:     Effort: Pulmonary effort is normal.     Breath sounds: Normal breath sounds.  Genitourinary:    Rectum: Normal.     Comments: Anal pap obtained - no obvious abnormalities on exam Skin:    General: Skin is warm and dry.  Neurological:     Mental Status: He is alert.     GCS: GCS eye subscore is 4. GCS verbal subscore is 5. GCS motor subscore is 6.  Psychiatric:        Speech: Speech normal.        Behavior: Behavior normal. Behavior is cooperative.     Assessment and Plan:   Screening examination for STD (sexually transmitted disease) Urine and anal sexual transmitted infection testing completed Anal pap also completed for HPV  Will be in touch with all results and recommendations Continue safe sex practices Declines rpr and HIV testing today  Elevated blood pressure reading Normotensive Continue current  lifestyle efforts to keep numbers well-controlled Please check your blood pressure once a week Message me in Jan with results so I know how they are trending at home Follow-up in 6 months for Comprehensive Physical Exam (CPE) preventive care annual visit   I,Emily Lagle,acting as a scribe for [employer], PA.,have documented all relevant documentation on the behalf of Cameron Motto, PA,as directed by  Cameron Motto, PA while in the presence of Cameron Patterson, [place].  I, Cameron Patterson, [place], have reviewed all documentation for this visit. The documentation on 04/21/23 for the exam, diagnosis, procedures, and orders are all accurate and complete.  Cameron Motto, PA-C

## 2023-04-21 NOTE — Patient Instructions (Addendum)
It was great to see you!  Please check your blood pressure once a week Message me in Jan with results so I know how they are trending at home  Let's follow-up in 6 months for Comprehensive Physical Exam (CPE) preventive care annual visit, sooner if you have concerns.  Take care,  Jarold Motto PA-C

## 2023-04-22 LAB — CYTOLOGY, (ORAL, ANAL, URETHRAL) ANCILLARY ONLY
Chlamydia: POSITIVE — AB
Comment: NEGATIVE
Comment: NEGATIVE
Comment: NEGATIVE
Comment: NEGATIVE
Comment: NEGATIVE
Comment: NORMAL
HPV 16: NEGATIVE
HPV 18 / 45: NEGATIVE
High risk HPV: POSITIVE — AB
Neisseria Gonorrhea: NEGATIVE
Trichomonas: NEGATIVE

## 2023-04-22 LAB — URINE CYTOLOGY ANCILLARY ONLY
Chlamydia: NEGATIVE
Comment: NEGATIVE
Comment: NEGATIVE
Comment: NORMAL
Neisseria Gonorrhea: NEGATIVE
Trichomonas: NEGATIVE

## 2023-04-23 ENCOUNTER — Other Ambulatory Visit: Payer: Self-pay | Admitting: Physician Assistant

## 2023-04-23 MED ORDER — DOXYCYCLINE HYCLATE 100 MG PO TABS: 100.0000 mg | ORAL_TABLET | Freq: Two times a day (BID) | ORAL | 0 refills | Status: AC

## 2023-10-29 ENCOUNTER — Encounter: Payer: 59 | Admitting: Physician Assistant
